# Patient Record
Sex: Female | Born: 1945 | Hispanic: No | Marital: Single | Smoking: Never smoker
Health system: Southern US, Community
[De-identification: ages and names within clinical notes are randomized; demographics above are authoritative.]

## PROBLEM LIST (undated history)

## (undated) DIAGNOSIS — H269 Unspecified cataract: Secondary | ICD-10-CM

## (undated) HISTORY — DX: Unspecified cataract: H26.9

## (undated) HISTORY — PX: OTHER SURGICAL HISTORY: SHX169

## (undated) HISTORY — PX: TONSILLECTOMY AND ADENOIDECTOMY: SUR1326

---

## 2009-02-23 HISTORY — PX: COLONOSCOPY: SHX174

## 2009-05-17 ENCOUNTER — Encounter (INDEPENDENT_AMBULATORY_CARE_PROVIDER_SITE_OTHER): Payer: Self-pay | Admitting: *Deleted

## 2009-05-29 ENCOUNTER — Encounter (INDEPENDENT_AMBULATORY_CARE_PROVIDER_SITE_OTHER): Payer: Self-pay | Admitting: *Deleted

## 2009-05-30 ENCOUNTER — Ambulatory Visit: Payer: Self-pay | Admitting: Gastroenterology

## 2009-06-17 ENCOUNTER — Ambulatory Visit: Payer: Self-pay | Admitting: Gastroenterology

## 2010-03-25 NOTE — Letter (Signed)
Summary: [facility] Instructions  [facility]  [address] [place], [place] [address]   Phone: [phone]  Fax: [phone]       Rita Olsen    05/11/1945    MRN: [id number]        Procedure Day Dorna Bloom:  Duanne Limerick  06/17/09     Arrival Time:  9:30AM     Procedure Time:  10:30AM     Location of Procedure:                    _ X_  [facility] (4th Floor)                        PREPARATION FOR COLONOSCOPY WITH MOVIPREP   Starting 5 days prior to your procedure 06/12/09 do not eat nuts, seeds, popcorn, corn, beans, peas,  salads, or any raw vegetables.  Do not take any fiber supplements (e.g. Metamucil, Citrucel, and Benefiber).  THE DAY BEFORE YOUR PROCEDURE         DATE: 06/16/09  DAY: SUNDAY  1.  Drink clear liquids the entire day-NO SOLID FOOD  2.  Do not drink anything colored red or purple.  Avoid juices with pulp.  No orange juice.  3.  Drink at least 64 oz. (8 glasses) of fluid/clear liquids during the day to prevent dehydration and help the prep work efficiently.  CLEAR LIQUIDS INCLUDE: Water Jello Ice Popsicles Tea (sugar ok, no milk/cream) Powdered fruit flavored drinks Coffee (sugar ok, no milk/cream) Gatorade Juice: apple, white grape, white cranberry  Lemonade Clear bullion, consomm, broth Carbonated beverages (any kind) Strained chicken noodle soup Hard Candy                             4.  In the morning, mix first dose of MoviPrep solution:    Empty 1 Pouch A and 1 Pouch B into the disposable container    Add lukewarm drinking water to the top line of the container. Mix to dissolve    Refrigerate (mixed solution should be used within 24 hrs)  5.  Begin drinking the prep at 5:00 p.m. The MoviPrep container is divided by 4 marks.   Every 15 minutes drink the solution down to the next mark (approximately 8 oz) until the full liter is complete.   6.  Follow completed prep with 16 oz of clear liquid of your choice  (Nothing red or purple).  Continue to drink clear liquids until bedtime.  7.  Before going to bed, mix second dose of MoviPrep solution:    Empty 1 Pouch A and 1 Pouch B into the disposable container    Add lukewarm drinking water to the top line of the container. Mix to dissolve    Refrigerate  THE DAY OF YOUR PROCEDURE      DATE: 06/17/09 DAY: MONDAY  Beginning at 5:30a.m. (5 hours before procedure):         1. Every 15 minutes, drink the solution down to the next mark (approx 8 oz) until the full liter is complete.  2. Follow completed prep with 16 oz. of clear liquid of your choice.    3. You may drink clear liquids until 8:30AM (2 HOURS BEFORE PROCEDURE).   MEDICATION INSTRUCTIONS  Unless otherwise instructed, you should take regular prescription medications with a small sip of water   as early as possible the morning of  your procedure.        OTHER INSTRUCTIONS  You will need a responsible adult at least 67 years of age to accompany you and drive you home.   This person must remain in the waiting room during your procedure.  Wear loose fitting clothing that is easily removed.  Leave jewelry and other valuables at home.  However, you may wish to bring a book to read or  an iPod/MP3 player to listen to music as you wait for your procedure to start.  Remove all body piercing jewelry and leave at home.  Total time from sign-in until discharge is approximately 2-3 hours.  You should go home directly after your procedure and rest.  You can resume normal activities the  day after your procedure.  The day of your procedure you should not:   Drive   Make legal decisions   Operate machinery   Drink alcohol   Return to work  You will receive specific instructions about eating, activities and medications before you leave.    The above instructions have been reviewed and explained to me by   Wyona Almas RN  May 30, 2009 8:23 AM     I fully understand  and can verbalize these instructions _____________________________ Date _________

## 2010-03-25 NOTE — Miscellaneous (Signed)
Summary: [facility] Previsit/prep  Clinical Lists Changes  Medications: Added new medication of MOVIPREP 100 GM  SOLR (PEG-KCL-NACL-NASULF-NA ASC-C) As per prep instructions. - Signed Rx of MOVIPREP 100 GM  SOLR (PEG-KCL-NACL-NASULF-NA ASC-C) As per prep instructions.;  #1 x 0;  Signed;  Entered by: Wyona Almas RN;  Authorized by: Mardella Layman MD [facility];  Method used: Electronically to [facility] [address], [place], [place], [place]  [address], Ph: [phone], Fax: [phone] Observations: Added new observation of NKA: T (05/30/2009 7:58)    Prescriptions: MOVIPREP 100 GM  SOLR (PEG-KCL-NACL-NASULF-NA ASC-C) As per prep instructions.  #1 x 0   Entered by:   Wyona Almas RN   Authorized by:   Mardella Layman MD [facility]   Signed by:   Wyona Almas RN on 05/30/2009   Method used:   Electronically to        [facility] (retail)       [address]       [place], [place]  [address]       Ph: [phone]       Fax: [phone]   RxID:   [id number]

## 2010-03-25 NOTE — Letter (Signed)
Summary: Previsit letter  [facility]  [address] [place], [place] [address]   Phone: [phone]  Fax: [phone]       05/17/2009 MRN: [id number]  Rita Olsen [address] [address] [place], [place]  [address]  Dear Ms. Spaugh,  Welcome to the Gastroenterology Division at [facility].    You are scheduled to see a nurse for your pre-procedure visit on 05/30/2009 at 8:00AM on the 3rd floor at [facility], [address]  We ask that you try to arrive at our office 15 minutes prior to your appointment time to allow for check-in.  Your nurse visit will consist of discussing your medical and surgical history, your immediate family medical history, and your medications.    Please bring a complete list of all your medications or, if you prefer, bring the medication bottles and we will list them.  We will need to be aware of both prescribed and over the counter drugs.  We will need to know exact dosage information as well.  If you are on blood thinners (Coumadin, Plavix, Aggrenox, Ticlid, etc.) please call our office today/prior to your appointment, as we need to consult with your physician about holding your medication.   Please be prepared to read and sign documents such as consent forms, a financial agreement, and acknowledgement forms.  If necessary, and with your consent, a friend or relative is welcome to sit-in on the nurse visit with you.  Please bring your insurance card so that we may make a copy of it.  If your insurance requires a referral to see a specialist, please bring your referral form from your primary care physician.  No co-pay is required for this nurse visit.     If you cannot keep your appointment, please call [phone] to cancel or reschedule prior to your appointment date.  This allows [place] the opportunity to schedule an appointment for another patient in need of care.    Thank you for choosing [facility] for your medical needs.  We  appreciate the opportunity to care for you.  Please visit [place] at our website  to learn more about our practice.                     Sincerely.                                                                                                                   The Gastroenterology Division

## 2010-03-25 NOTE — Procedures (Signed)
Summary: Colonoscopy  Patient: [name] Note: All result statuses are Final unless otherwise noted.  Tests: (1) Colonoscopy (COL)   COL Colonoscopy           DONE     [facility]     [address]     [place], [place]  [address]           COLONOSCOPY PROCEDURE REPORT           PATIENT:  Rita Olsen, Rita Olsen  MR#:  [id number]     BIRTHDATE:  1945/07/04, 63 yrs. old  GENDER:  female     ENDOSCOPIST:  Vania Rea. Jarold Motto, MD, [facility]     REF. BY:  Charlesetta Shanks, M.D.     PROCEDURE DATE:  06/17/2009     PROCEDURE:  Average-risk screening colonoscopy     G0121     ASA CLASS:  Class I     INDICATIONS:  Routine Risk Screening     MEDICATIONS:   Fentanyl 50 mcg IV, Versed 5 mg IV           DESCRIPTION OF PROCEDURE:   After the risks benefits and     alternatives of the procedure were thoroughly explained, informed     consent was obtained.  Digital rectal exam was performed and     revealed no abnormalities.   The LB [id number] endoscope     was introduced through the anus and advanced to the cecum, which     was identified by both the appendix and ileocecal valve, without     limitations.  The quality of the prep was excellent, using     MoviPrep.  The instrument was then slowly withdrawn as the colon     was fully examined.     <<PROCEDUREIMAGES>>           FINDINGS:  Melanosis coli was found.  No polyps or cancers were     seen.  This was otherwise a normal examination of the colon.     Retroflexed views in the rectum revealed not done.    The scope was     then withdrawn from the patient and the procedure completed.           COMPLICATIONS:  None     ENDOSCOPIC IMPRESSION:     1) Melanosis     2) No polyps or cancers     3) Otherwise normal examination     RECOMMENDATIONS:     1) Continue current colorectal screening recommendations for     "routine risk" patients with a repeat colonoscopy in 10 years.     REPEAT EXAM:  No        ______________________________     Vania Rea. Jarold Motto, MD, [facility]           CC:           n.     eSIGNED:   Vania Rea. Nathaniel Wakeley at 06/17/2009 11:19 AM           Ruby Cola, [id number]  Note: An exclamation mark (!) indicates a result that was not dispersed into the flowsheet. Document Creation Date: 06/17/2009 11:19 AM _______________________________________________________________________  (1) Order result status: Final Collection or observation date-time: 06/17/2009 11:14 Requested date-time:  Receipt date-time:  Reported date-time:  Referring Physician:   Ordering Physician: Sheryn Bison [phone]) Specimen Source:  Source: Launa Grill Order Number: [id number] Lab site:   Appended Document: Colonoscopy    Clinical Lists Changes  Observations:  Added new observation of COLONNXTDUE: 05/2019 (06/17/2009 12:42)

## 2011-04-15 ENCOUNTER — Other Ambulatory Visit (HOSPITAL_COMMUNITY): Payer: Self-pay | Admitting: Radiology

## 2011-04-15 DIAGNOSIS — R011 Cardiac murmur, unspecified: Secondary | ICD-10-CM

## 2011-04-17 ENCOUNTER — Other Ambulatory Visit: Payer: Self-pay

## 2011-04-17 ENCOUNTER — Encounter (HOSPITAL_COMMUNITY): Payer: Self-pay | Admitting: Internal Medicine

## 2011-04-17 ENCOUNTER — Ambulatory Visit (HOSPITAL_COMMUNITY): Payer: Medicare HMO | Attending: Cardiology

## 2011-04-17 DIAGNOSIS — R011 Cardiac murmur, unspecified: Secondary | ICD-10-CM

## 2011-04-17 DIAGNOSIS — I080 Rheumatic disorders of both mitral and aortic valves: Secondary | ICD-10-CM | POA: Insufficient documentation

## 2014-10-05 DIAGNOSIS — Z961 Presence of intraocular lens: Secondary | ICD-10-CM | POA: Diagnosis not present

## 2014-10-05 DIAGNOSIS — H01001 Unspecified blepharitis right upper eyelid: Secondary | ICD-10-CM | POA: Diagnosis not present

## 2014-10-05 DIAGNOSIS — H01004 Unspecified blepharitis left upper eyelid: Secondary | ICD-10-CM | POA: Diagnosis not present

## 2015-03-06 DIAGNOSIS — N390 Urinary tract infection, site not specified: Secondary | ICD-10-CM | POA: Diagnosis not present

## 2015-03-06 DIAGNOSIS — E559 Vitamin D deficiency, unspecified: Secondary | ICD-10-CM | POA: Diagnosis not present

## 2015-03-06 DIAGNOSIS — Z0000 Encounter for general adult medical examination without abnormal findings: Secondary | ICD-10-CM | POA: Diagnosis not present

## 2015-03-06 DIAGNOSIS — R8299 Other abnormal findings in urine: Secondary | ICD-10-CM | POA: Diagnosis not present

## 2015-03-13 DIAGNOSIS — E559 Vitamin D deficiency, unspecified: Secondary | ICD-10-CM | POA: Diagnosis not present

## 2015-03-13 DIAGNOSIS — Z0000 Encounter for general adult medical examination without abnormal findings: Secondary | ICD-10-CM | POA: Diagnosis not present

## 2015-03-13 DIAGNOSIS — R011 Cardiac murmur, unspecified: Secondary | ICD-10-CM | POA: Diagnosis not present

## 2015-03-13 DIAGNOSIS — Z780 Asymptomatic menopausal state: Secondary | ICD-10-CM | POA: Diagnosis not present

## 2015-03-13 DIAGNOSIS — M8580 Other specified disorders of bone density and structure, unspecified site: Secondary | ICD-10-CM | POA: Diagnosis not present

## 2015-03-13 DIAGNOSIS — Z6827 Body mass index (BMI) 27.0-27.9, adult: Secondary | ICD-10-CM | POA: Diagnosis not present

## 2015-03-13 DIAGNOSIS — Z1389 Encounter for screening for other disorder: Secondary | ICD-10-CM | POA: Diagnosis not present

## 2015-03-14 DIAGNOSIS — Z1212 Encounter for screening for malignant neoplasm of rectum: Secondary | ICD-10-CM | POA: Diagnosis not present

## 2015-03-22 DIAGNOSIS — R928 Other abnormal and inconclusive findings on diagnostic imaging of breast: Secondary | ICD-10-CM | POA: Diagnosis not present

## 2015-03-27 DIAGNOSIS — Z780 Asymptomatic menopausal state: Secondary | ICD-10-CM | POA: Diagnosis not present

## 2015-03-27 DIAGNOSIS — E559 Vitamin D deficiency, unspecified: Secondary | ICD-10-CM | POA: Diagnosis not present

## 2015-03-27 DIAGNOSIS — M859 Disorder of bone density and structure, unspecified: Secondary | ICD-10-CM | POA: Diagnosis not present

## 2015-06-27 DIAGNOSIS — L309 Dermatitis, unspecified: Secondary | ICD-10-CM | POA: Diagnosis not present

## 2015-10-11 DIAGNOSIS — Z961 Presence of intraocular lens: Secondary | ICD-10-CM | POA: Diagnosis not present

## 2016-03-19 DIAGNOSIS — Z0000 Encounter for general adult medical examination without abnormal findings: Secondary | ICD-10-CM | POA: Diagnosis not present

## 2016-03-19 DIAGNOSIS — E559 Vitamin D deficiency, unspecified: Secondary | ICD-10-CM | POA: Diagnosis not present

## 2016-03-27 DIAGNOSIS — M859 Disorder of bone density and structure, unspecified: Secondary | ICD-10-CM | POA: Diagnosis not present

## 2016-03-27 DIAGNOSIS — Z1389 Encounter for screening for other disorder: Secondary | ICD-10-CM | POA: Diagnosis not present

## 2016-03-27 DIAGNOSIS — E559 Vitamin D deficiency, unspecified: Secondary | ICD-10-CM | POA: Diagnosis not present

## 2016-03-27 DIAGNOSIS — Z6827 Body mass index (BMI) 27.0-27.9, adult: Secondary | ICD-10-CM | POA: Diagnosis not present

## 2016-03-27 DIAGNOSIS — D72819 Decreased white blood cell count, unspecified: Secondary | ICD-10-CM | POA: Diagnosis not present

## 2016-03-27 DIAGNOSIS — H6123 Impacted cerumen, bilateral: Secondary | ICD-10-CM | POA: Diagnosis not present

## 2016-03-27 DIAGNOSIS — Z780 Asymptomatic menopausal state: Secondary | ICD-10-CM | POA: Diagnosis not present

## 2016-03-27 DIAGNOSIS — Z0000 Encounter for general adult medical examination without abnormal findings: Secondary | ICD-10-CM | POA: Diagnosis not present

## 2016-03-27 DIAGNOSIS — R011 Cardiac murmur, unspecified: Secondary | ICD-10-CM | POA: Diagnosis not present

## 2016-04-03 DIAGNOSIS — Z1212 Encounter for screening for malignant neoplasm of rectum: Secondary | ICD-10-CM | POA: Diagnosis not present

## 2016-11-06 DIAGNOSIS — Z961 Presence of intraocular lens: Secondary | ICD-10-CM | POA: Diagnosis not present

## 2017-05-12 DIAGNOSIS — R82998 Other abnormal findings in urine: Secondary | ICD-10-CM | POA: Diagnosis not present

## 2017-05-12 DIAGNOSIS — E559 Vitamin D deficiency, unspecified: Secondary | ICD-10-CM | POA: Diagnosis not present

## 2017-05-12 DIAGNOSIS — D72819 Decreased white blood cell count, unspecified: Secondary | ICD-10-CM | POA: Diagnosis not present

## 2017-05-12 DIAGNOSIS — Z0000 Encounter for general adult medical examination without abnormal findings: Secondary | ICD-10-CM | POA: Diagnosis not present

## 2017-05-21 DIAGNOSIS — E559 Vitamin D deficiency, unspecified: Secondary | ICD-10-CM | POA: Diagnosis not present

## 2017-05-21 DIAGNOSIS — R21 Rash and other nonspecific skin eruption: Secondary | ICD-10-CM | POA: Diagnosis not present

## 2017-05-21 DIAGNOSIS — D72819 Decreased white blood cell count, unspecified: Secondary | ICD-10-CM | POA: Diagnosis not present

## 2017-05-21 DIAGNOSIS — R011 Cardiac murmur, unspecified: Secondary | ICD-10-CM | POA: Diagnosis not present

## 2017-05-21 DIAGNOSIS — Z9289 Personal history of other medical treatment: Secondary | ICD-10-CM | POA: Diagnosis not present

## 2017-05-21 DIAGNOSIS — R9431 Abnormal electrocardiogram [ECG] [EKG]: Secondary | ICD-10-CM | POA: Diagnosis not present

## 2017-05-21 DIAGNOSIS — Z0000 Encounter for general adult medical examination without abnormal findings: Secondary | ICD-10-CM | POA: Diagnosis not present

## 2017-05-21 DIAGNOSIS — Z780 Asymptomatic menopausal state: Secondary | ICD-10-CM | POA: Diagnosis not present

## 2017-05-21 DIAGNOSIS — M8580 Other specified disorders of bone density and structure, unspecified site: Secondary | ICD-10-CM | POA: Diagnosis not present

## 2017-05-26 DIAGNOSIS — Z1212 Encounter for screening for malignant neoplasm of rectum: Secondary | ICD-10-CM | POA: Diagnosis not present

## 2017-07-01 DIAGNOSIS — M859 Disorder of bone density and structure, unspecified: Secondary | ICD-10-CM | POA: Diagnosis not present

## 2017-07-01 DIAGNOSIS — E8809 Other disorders of plasma-protein metabolism, not elsewhere classified: Secondary | ICD-10-CM | POA: Diagnosis not present

## 2017-07-06 DIAGNOSIS — D890 Polyclonal hypergammaglobulinemia: Secondary | ICD-10-CM | POA: Diagnosis not present

## 2017-08-30 DIAGNOSIS — Z6827 Body mass index (BMI) 27.0-27.9, adult: Secondary | ICD-10-CM | POA: Diagnosis not present

## 2017-08-30 DIAGNOSIS — M7090 Unspecified soft tissue disorder related to use, overuse and pressure of unspecified site: Secondary | ICD-10-CM | POA: Diagnosis not present

## 2017-08-30 DIAGNOSIS — R1084 Generalized abdominal pain: Secondary | ICD-10-CM | POA: Diagnosis not present

## 2017-08-30 DIAGNOSIS — N390 Urinary tract infection, site not specified: Secondary | ICD-10-CM | POA: Diagnosis not present

## 2017-08-30 DIAGNOSIS — R109 Unspecified abdominal pain: Secondary | ICD-10-CM | POA: Diagnosis not present

## 2017-11-11 DIAGNOSIS — H52202 Unspecified astigmatism, left eye: Secondary | ICD-10-CM | POA: Diagnosis not present

## 2017-11-11 DIAGNOSIS — Z961 Presence of intraocular lens: Secondary | ICD-10-CM | POA: Diagnosis not present

## 2018-01-05 DIAGNOSIS — Z1231 Encounter for screening mammogram for malignant neoplasm of breast: Secondary | ICD-10-CM | POA: Diagnosis not present

## 2018-06-20 DIAGNOSIS — R7989 Other specified abnormal findings of blood chemistry: Secondary | ICD-10-CM | POA: Diagnosis not present

## 2018-06-20 DIAGNOSIS — E559 Vitamin D deficiency, unspecified: Secondary | ICD-10-CM | POA: Diagnosis not present

## 2018-06-20 DIAGNOSIS — R82998 Other abnormal findings in urine: Secondary | ICD-10-CM | POA: Diagnosis not present

## 2018-06-21 DIAGNOSIS — Z1331 Encounter for screening for depression: Secondary | ICD-10-CM | POA: Diagnosis not present

## 2018-06-21 DIAGNOSIS — Z1389 Encounter for screening for other disorder: Secondary | ICD-10-CM | POA: Diagnosis not present

## 2018-06-21 DIAGNOSIS — Z0000 Encounter for general adult medical examination without abnormal findings: Secondary | ICD-10-CM | POA: Diagnosis not present

## 2018-11-17 DIAGNOSIS — Z961 Presence of intraocular lens: Secondary | ICD-10-CM | POA: Diagnosis not present

## 2019-03-28 DIAGNOSIS — H16123 Filamentary keratitis, bilateral: Secondary | ICD-10-CM | POA: Diagnosis not present

## 2019-04-05 DIAGNOSIS — H04123 Dry eye syndrome of bilateral lacrimal glands: Secondary | ICD-10-CM | POA: Diagnosis not present

## 2019-07-21 DIAGNOSIS — R7989 Other specified abnormal findings of blood chemistry: Secondary | ICD-10-CM | POA: Diagnosis not present

## 2019-07-21 DIAGNOSIS — M859 Disorder of bone density and structure, unspecified: Secondary | ICD-10-CM | POA: Diagnosis not present

## 2019-07-28 DIAGNOSIS — Z0000 Encounter for general adult medical examination without abnormal findings: Secondary | ICD-10-CM | POA: Diagnosis not present

## 2019-07-28 DIAGNOSIS — Z9289 Personal history of other medical treatment: Secondary | ICD-10-CM | POA: Diagnosis not present

## 2019-07-28 DIAGNOSIS — M859 Disorder of bone density and structure, unspecified: Secondary | ICD-10-CM | POA: Diagnosis not present

## 2019-07-28 DIAGNOSIS — E7849 Other hyperlipidemia: Secondary | ICD-10-CM | POA: Diagnosis not present

## 2019-07-28 DIAGNOSIS — R21 Rash and other nonspecific skin eruption: Secondary | ICD-10-CM | POA: Diagnosis not present

## 2019-07-28 DIAGNOSIS — D72819 Decreased white blood cell count, unspecified: Secondary | ICD-10-CM | POA: Diagnosis not present

## 2019-07-28 DIAGNOSIS — R82998 Other abnormal findings in urine: Secondary | ICD-10-CM | POA: Diagnosis not present

## 2019-07-28 DIAGNOSIS — R9431 Abnormal electrocardiogram [ECG] [EKG]: Secondary | ICD-10-CM | POA: Diagnosis not present

## 2019-07-28 DIAGNOSIS — H6123 Impacted cerumen, bilateral: Secondary | ICD-10-CM | POA: Diagnosis not present

## 2019-07-28 DIAGNOSIS — R011 Cardiac murmur, unspecified: Secondary | ICD-10-CM | POA: Diagnosis not present

## 2019-07-28 DIAGNOSIS — E559 Vitamin D deficiency, unspecified: Secondary | ICD-10-CM | POA: Diagnosis not present

## 2019-08-01 DIAGNOSIS — Z1231 Encounter for screening mammogram for malignant neoplasm of breast: Secondary | ICD-10-CM | POA: Diagnosis not present

## 2019-08-03 ENCOUNTER — Other Ambulatory Visit: Payer: Self-pay | Admitting: Internal Medicine

## 2019-08-03 ENCOUNTER — Encounter: Payer: Self-pay | Admitting: Gastroenterology

## 2019-08-03 DIAGNOSIS — E785 Hyperlipidemia, unspecified: Secondary | ICD-10-CM

## 2019-08-09 DIAGNOSIS — Z1212 Encounter for screening for malignant neoplasm of rectum: Secondary | ICD-10-CM | POA: Diagnosis not present

## 2019-08-22 ENCOUNTER — Ambulatory Visit
Admission: RE | Admit: 2019-08-22 | Discharge: 2019-08-22 | Disposition: A | Discharge status: Home / Self Care | Payer: Medicare HMO | Source: Ambulatory Visit | Attending: Internal Medicine | Admitting: Internal Medicine

## 2019-08-22 DIAGNOSIS — E785 Hyperlipidemia, unspecified: Secondary | ICD-10-CM

## 2019-08-31 ENCOUNTER — Encounter: Payer: Self-pay | Admitting: Gastroenterology

## 2019-09-06 DIAGNOSIS — H6123 Impacted cerumen, bilateral: Secondary | ICD-10-CM | POA: Diagnosis not present

## 2019-09-06 DIAGNOSIS — J343 Hypertrophy of nasal turbinates: Secondary | ICD-10-CM | POA: Diagnosis not present

## 2019-09-06 DIAGNOSIS — Z9089 Acquired absence of other organs: Secondary | ICD-10-CM | POA: Diagnosis not present

## 2019-09-20 ENCOUNTER — Other Ambulatory Visit: Payer: Self-pay

## 2019-09-20 ENCOUNTER — Encounter: Payer: Self-pay | Admitting: Gastroenterology

## 2019-09-20 ENCOUNTER — Ambulatory Visit (AMBULATORY_SURGERY_CENTER): Payer: Self-pay

## 2019-09-20 VITALS — Ht 63.0 in | Wt 152.4 lb

## 2019-09-20 DIAGNOSIS — Z1211 Encounter for screening for malignant neoplasm of colon: Secondary | ICD-10-CM

## 2019-09-20 NOTE — Progress Notes (Signed)
No allergies to soy or egg Pt is not on blood thinners or diet pills Denies issues with sedation/intubation Denies atrial flutter/fib Denies constipation   Pt is aware of Covid safety and care partner requirements.      

## 2019-10-04 ENCOUNTER — Encounter: Payer: Self-pay | Admitting: Gastroenterology

## 2019-10-04 ENCOUNTER — Other Ambulatory Visit: Payer: Self-pay

## 2019-10-04 ENCOUNTER — Ambulatory Visit (AMBULATORY_SURGERY_CENTER): Payer: Medicare HMO | Admitting: Gastroenterology

## 2019-10-04 VITALS — BP 140/78 | HR 55 | Temp 97.7°F | Resp 21 | Ht 63.0 in | Wt 152.4 lb

## 2019-10-04 DIAGNOSIS — Z1211 Encounter for screening for malignant neoplasm of colon: Secondary | ICD-10-CM | POA: Diagnosis not present

## 2019-10-04 DIAGNOSIS — Z86010 Personal history of colonic polyps: Secondary | ICD-10-CM | POA: Diagnosis not present

## 2019-10-04 MED ORDER — SODIUM CHLORIDE 0.9 % IV SOLN
500.0000 mL | Freq: Once | INTRAVENOUS | Status: DC
Start: 2019-10-04 — End: 2019-10-04

## 2019-10-04 NOTE — Progress Notes (Signed)
Pt's states no medical or surgical changes since previsit or office visit. 

## 2019-10-04 NOTE — Progress Notes (Signed)
Report given to PACU, vss 

## 2019-10-04 NOTE — Patient Instructions (Signed)
Thank you for allowing [place] to care for you today!  Resume previous diet and medications today.  Recommend incorporating a high-fiber diet, hand-out given.  Recommend adding FiberCon 1-2 tablets by mouth daily.  Return to your normal activities tomorrow.  No further routine colonoscopies recommended based on current age guidelines.  Please contact us if any issues should arise.    YOU HAD AN ENDOSCOPIC PROCEDURE TODAY AT THE [facility]:   Refer to the procedure report that was given to you for any specific questions about what was found during the examination.  If the procedure report does not answer your questions, please call your gastroenterologist to clarify.  If you requested that your care partner not be given the details of your procedure findings, then the procedure report has been included in a sealed envelope for you to review at your convenience later.  YOU SHOULD EXPECT: Some feelings of bloating in the abdomen. Passage of more gas than usual.  Walking can help get rid of the air that was put into your GI tract during the procedure and reduce the bloating. If you had a lower endoscopy (such as a colonoscopy or flexible sigmoidoscopy) you may notice spotting of blood in your stool or on the toilet paper. If you underwent a bowel prep for your procedure, you may not have a normal bowel movement for a few days.  Please Note:  You might notice some irritation and congestion in your nose or some drainage.  This is from the oxygen used during your procedure.  There is no need for concern and it should clear up in a day or so.  SYMPTOMS TO REPORT IMMEDIATELY:   Following lower endoscopy (colonoscopy or flexible sigmoidoscopy):  Excessive amounts of blood in the stool  Significant tenderness or worsening of abdominal pains  Swelling of the abdomen that is new, acute  Fever of 100F or higher   For urgent or emergent issues, a gastroenterologist can be reached at any hour  by calling [phone]. Do not use MyChart messaging for urgent concerns.    DIET:  We do recommend a small meal at first, but then you may proceed to your regular diet.  Drink plenty of fluids but you should avoid alcoholic beverages for 24 hours.  ACTIVITY:  You should plan to take it easy for the rest of today and you should NOT DRIVE or use heavy machinery until tomorrow (because of the sedation medicines used during the test).    FOLLOW UP: Our staff will call the number listed on your records 48-72 hours following your procedure to check on you and address any questions or concerns that you may have regarding the information given to you following your procedure. If we do not reach you, we will leave a message.  We will attempt to reach you two times.  During this call, we will ask if you have developed any symptoms of COVID 19. If you develop any symptoms (ie: fever, flu-like symptoms, shortness of breath, cough etc.) before then, please call [phone].  If you test positive for Covid 19 in the 2 weeks post procedure, please call and report this information to [place].    If any biopsies were taken you will be contacted by phone or by letter within the next 1-3 weeks.  Please call us at [phone] if you have not heard about the biopsies in 3 weeks.    SIGNATURES/CONFIDENTIALITY: You and/or your care partner have signed paperwork which will  be entered into your electronic medical record.  These signatures attest to the fact that that the information above on your After Visit Summary has been reviewed and is understood.  Full responsibility of the confidentiality of this discharge information lies with you and/or your care-partner. 

## 2019-10-04 NOTE — Op Note (Signed)
[facility] Patient Name: Rita Olsen Procedure Date: 10/04/2019 9:08 AM MRN: [id number] Endoscopist: Justice Britain , MD Age: 74 Referring MD:  Date of Birth: Oct 25, 1945 Gender: Female Account #: [id number] Procedure:                Colonoscopy Indications:              Screening for colorectal malignant neoplasm Medicines:                Monitored Anesthesia Care Procedure:                Pre-Anesthesia Assessment:                           - Prior to the procedure, a History and Physical                            was performed, and patient medications and                            allergies were reviewed. The patient's tolerance of                            previous anesthesia was also reviewed. The risks                            and benefits of the procedure and the sedation                            options and risks were discussed with the patient.                            All questions were answered, and informed consent                            was obtained. Prior Anticoagulants: The patient has                            taken no previous anticoagulant or antiplatelet                            agents. ASA Grade Assessment: I - A normal, healthy                            patient. After reviewing the risks and benefits,                            the patient was deemed in satisfactory condition to                            undergo the procedure.                           After obtaining informed consent, the colonoscope  was passed under direct vision. Throughout the                            procedure, the patient's blood pressure, pulse, and                            oxygen saturations were monitored continuously. The                            Colonoscope was introduced through the anus and                            advanced to the the cecum, identified by                            appendiceal orifice and ileocecal  valve. The                            colonoscopy was somewhat difficult due to a                            tortuous colon. Successful completion of the                            procedure was aided by changing the patient's                            position, using manual pressure, straightening and                            shortening the scope to obtain bowel loop reduction                            and using scope torsion. The patient tolerated the                            procedure. The quality of the bowel preparation was                            good. The ileocecal valve, appendiceal orifice, and                            rectum were photographed. Scope In: 9:19:59 AM Scope Out: 9:37:20 AM Scope Withdrawal Time: 0 hours 11 minutes 32 seconds  Total Procedure Duration: 0 hours 17 minutes 21 seconds  Findings:                 The digital rectal exam findings include                            hemorrhoids. Pertinent negatives include no                            palpable rectal lesions.  The terminal ileum and ileocecal valve appeared                            normal.                           Other than some melanosis scattered through the                            colon, normal mucosa was found in the entire colon.                           Diverticulosis noted in the distal sigmoid colon                            and rectosigmoid colon.                           Non-bleeding non-thrombosed internal hemorrhoids                            were found during retroflexion, during perianal                            exam and during digital exam. The hemorrhoids were                            Grade II (internal hemorrhoids that prolapse but                            reduce spontaneously). Complications:            No immediate complications. Estimated Blood Loss:     Estimated blood loss: none. Impression:               - Hemorrhoids found on  digital rectal exam.                           - Normal mucosa at TI and IC Valve.                           - Melanosis scattered but otherwise normal mucosa                            in the entire examined colon.                           - Diverticulosis noted in the distal sigmoid colon                            and rectosigmoid colon.                           - Non-bleeding non-thrombosed internal hemorrhoids. Recommendation:           - The patient will be observed post-procedure,  until all discharge criteria are met.                           - Discharge patient to home.                           - Patient has a contact number available for                            emergencies. The signs and symptoms of potential                            delayed complications were discussed with the                            patient. Return to normal activities tomorrow.                            Written discharge instructions were provided to the                            patient.                           - High fiber diet.                           - Use FiberCon 1-2 tablets PO daily.                           - Continue present medications.                           - Would normally recommend a repeat colonoscopy in                            10 years for screening purposes but her age will be                            75. If her health remains excellent then we can                            certainly entertain repeat colonoscopy but would                            need to discuss with PCP and with [place] in clinic.                            Likely this will be her last colonoscopy.                           - The findings and recommendations were discussed                            with the patient.  Justice Britain, MD 10/04/2019 9:44:36 AM

## 2019-10-06 ENCOUNTER — Telehealth: Payer: Self-pay | Admitting: *Deleted

## 2019-10-06 NOTE — Telephone Encounter (Signed)
  Follow up Call-  Call back number 10/04/2019  Post procedure Call Back phone  # [phone]  Permission to leave phone message Yes  Some recent data might be hidden     Patient questions:  Do you have a fever, pain , or abdominal swelling? No. Pain Score  0 *  Have you tolerated food without any problems? Yes.    Have you been able to return to your normal activities? Yes.    Do you have any questions about your discharge instructions: Diet   No. Medications  No. Follow up visit  No.  Do you have questions or concerns about your Care? Yes.    Actions: * If pain score is 4 or above: 1. No action needed, pain <4.Have you developed a fever since your procedure? no  2.   Have you had an respiratory symptoms (SOB or cough) since your procedure? no  3.   Have you tested positive for COVID 19 since your procedure no  4.   Have you had any family members/close contacts diagnosed with the COVID 19 since your procedure?  no   If yes to any of these questions please route to Laverna Peace, RN and Karlton Lemon, RN

## 2019-10-06 NOTE — Telephone Encounter (Signed)
  Follow up Call-  Call back number 10/04/2019  Post procedure Call Back phone  # [phone]  Permission to leave phone message Yes  Some recent data might be hidden     Patient questions:  Do you have a fever, pain , or abdominal swelling? No. Pain Score  0 *  Have you tolerated food without any problems? Yes.    Have you been able to return to your normal activities? Yes.    Do you have any questions about your discharge instructions: Diet   No. Medications  No. Follow up visit  No.  Do you have questions or concerns about your Care? No.  Actions: * If pain score is 4 or above: 1. No action needed, pain <4.Have you developed a fever since your procedure? no  2.   Have you had an respiratory symptoms (SOB or cough) since your procedure? no  3.   Have you tested positive for COVID 19 since your procedure no  4.   Have you had any family members/close contacts diagnosed with the COVID 19 since your procedure?  no   If yes to any of these questions please route to Laverna Peace, RN and Karlton Lemon, RN

## 2019-11-05 ENCOUNTER — Ambulatory Visit: Payer: Medicare HMO | Attending: Internal Medicine

## 2019-11-05 ENCOUNTER — Other Ambulatory Visit: Payer: Self-pay

## 2019-11-05 DIAGNOSIS — Z23 Encounter for immunization: Secondary | ICD-10-CM

## 2019-11-05 NOTE — Progress Notes (Signed)
   Covid-19 Vaccination Clinic  Name:  Gwenivere Hiraldo    MRN: [id number] DOB: [birth year]  11/05/2019  Ms. Lansdowne was observed post Covid-19 immunization for 15 minutes without incident. She was provided with Vaccine Information Sheet and instruction to access the V-Safe system.   Ms. Wisham was instructed to call 911 with any severe reactions post vaccine: Marland Kitchen Difficulty breathing  . Swelling of face and throat  . A fast heartbeat  . A bad rash all over body  . Dizziness and weakness

## 2019-11-22 DIAGNOSIS — R0789 Other chest pain: Secondary | ICD-10-CM | POA: Diagnosis not present

## 2019-11-22 DIAGNOSIS — Z23 Encounter for immunization: Secondary | ICD-10-CM | POA: Diagnosis not present

## 2019-11-24 DIAGNOSIS — H16123 Filamentary keratitis, bilateral: Secondary | ICD-10-CM | POA: Diagnosis not present

## 2019-11-24 DIAGNOSIS — Z961 Presence of intraocular lens: Secondary | ICD-10-CM | POA: Diagnosis not present

## 2019-11-24 DIAGNOSIS — H04123 Dry eye syndrome of bilateral lacrimal glands: Secondary | ICD-10-CM | POA: Diagnosis not present

## 2020-06-07 ENCOUNTER — Ambulatory Visit
Admission: EM | Admit: 2020-06-07 | Discharge: 2020-06-07 | Disposition: A | Discharge status: Home / Self Care | Payer: Medicare HMO | Attending: Family Medicine | Admitting: Family Medicine

## 2020-06-07 ENCOUNTER — Encounter: Payer: Self-pay | Admitting: Emergency Medicine

## 2020-06-07 ENCOUNTER — Other Ambulatory Visit: Payer: Self-pay

## 2020-06-07 DIAGNOSIS — M79671 Pain in right foot: Secondary | ICD-10-CM | POA: Diagnosis not present

## 2020-06-07 DIAGNOSIS — M795 Residual foreign body in soft tissue: Secondary | ICD-10-CM | POA: Diagnosis not present

## 2020-06-07 NOTE — ED Provider Notes (Signed)
[name]    CSN: [id number] Arrival date & time: 06/07/20  1124      History   Chief Complaint Chief Complaint  Patient presents with  . Foreign Body in Skin    Right foot    HPI Rita Olsen is a 75 y.o. female.   Reports that she feels that there is "something embedded in the bottom of the right foot" for the last 2 days. Describes the area as red, painful to walk on. Denies drainage or bleeding from the area. Reports that she often exercises barefoot. Denies previous symptoms. Has not attempted OTC treatment. Denies weakness, erythema, drainage, swelling, fever, rash, other symptoms.  ROS per HPI  The history is provided by the patient.    Past Medical History:  Diagnosis Date  . Cataract    bilaterally and repaired.    There are no problems to display for this patient.   Past Surgical History:  Procedure Laterality Date  . Cataract repair    . COLONOSCOPY  2011  . TONSILLECTOMY AND ADENOIDECTOMY      OB History   No obstetric history on file.      Home Medications    Prior to Admission medications   Medication Sig Start Date End Date Taking? Authorizing Provider  COD LIVER OIL PO Take by mouth.    [provider]  Multiple Vitamin (MULTIVITAMIN PO) Take by mouth.    [provider]  Polyethyl Glycol-Propyl Glycol (SYSTANE HYDRATION PF OP) Apply 1 drop to eye.    [provider]    Family History Family History  Problem Relation Age of Onset  . Colon polyps Sister   . Colon cancer Neg Hx   . Esophageal cancer Neg Hx   . Prostate cancer Neg Hx   . Rectal cancer Neg Hx     Social History Social History   Tobacco Use  . Smoking status: Never Smoker  . Smokeless tobacco: Never Used  Vaping Use  . Vaping Use: Never used  Substance Use Topics  . Alcohol use: Yes    Comment: occassionally  . Drug use: Never     Allergies   Patient has no known allergies.   Review of Systems Review of  Systems   Physical Exam Triage Vital Signs ED Triage Vitals  Enc Vitals Group     BP 06/07/20 1224 138/78     Pulse Rate 06/07/20 1224 71     Resp 06/07/20 1224 16     Temp 06/07/20 1224 98.1 F (36.7 C)     Temp Source 06/07/20 1224 Oral     SpO2 06/07/20 1224 97 %     Weight --      Height --      Head Circumference --      Peak Flow --      Pain Score 06/07/20 1222 0     Pain Loc --      Pain Edu? --      Excl. in GC? --    No data found.  Updated Vital Signs BP 138/78 (BP Location: Right Arm)   Pulse 71   Temp 98.1 F (36.7 C) (Oral)   Resp 16   SpO2 97%     Physical Exam Vitals and nursing note reviewed.  Constitutional:      General: She is not in acute distress.    Appearance: Normal appearance. She is well-developed. She is not ill-appearing.  HENT:     Head: Normocephalic  and atraumatic.  Eyes:     Extraocular Movements: Extraocular movements intact.     Conjunctiva/sclera: Conjunctivae normal.     Pupils: Pupils are equal, round, and reactive to light.  Cardiovascular:     Rate and Rhythm: Normal rate and regular rhythm.     Heart sounds: No murmur heard.   Pulmonary:     Effort: Pulmonary effort is normal. No respiratory distress.     Breath sounds: Normal breath sounds.  Abdominal:     Palpations: Abdomen is soft.     Tenderness: There is no abdominal tenderness.  Musculoskeletal:        General: Normal range of motion.     Cervical back: Normal range of motion and neck supple.       Feet:  Skin:    General: Skin is warm and dry.     Capillary Refill: Capillary refill takes less than 2 seconds.  Neurological:     General: No focal deficit present.     Mental Status: She is alert and oriented to person, place, and time.  Psychiatric:        Mood and Affect: Mood normal.        Behavior: Behavior normal.      UC Treatments / Results  Labs (all labs ordered are listed, but only abnormal results are displayed) Labs Reviewed - No  data to display  EKG   Radiology No results found.  Procedures Foreign Body Removal  Date/Time: 06/07/2020 12:38 PM Performed by: Moshe Cipro, NP Authorized by: Moshe Cipro, NP   Consent:    Consent obtained:  Verbal   Consent given by:  Patient   Risks discussed:  Bleeding, infection and incomplete removal   Alternatives discussed:  No treatment and alternative treatment Universal protocol:    Patient identity confirmed:  Verbally with patient and arm band Location:    Location:  Foot   Foot location:  R sole   Depth:  Intradermal   Tendon involvement:  None Pre-procedure details:    Imaging:  None   Neurovascular status: intact   Anesthesia:    Anesthesia method:  None Procedure type:    Procedure complexity:  Simple Procedure details:    Localization method:  Visualized   Dissection of underlying tissues: no     Bloodless field: yes     Removal mechanism:  Forceps   Foreign bodies recovered:  1   Description:  Small shard of glass   Intact foreign body removal: yes   Post-procedure details:    Neurovascular status: intact     Confirmation:  No additional foreign bodies on visualization   Skin closure:  None   Dressing:  Open (no dressing)   Procedure completion:  Tolerated well, no immediate complications   (including critical care time)  Medications Ordered in UC Medications - No data to display  Initial Impression / Assessment and Plan / UC Course  I have reviewed the triage vital signs and the nursing notes.  Pertinent labs & imaging results that were available during my care of the patient were reviewed by me and considered in my medical decision making (see chart for details).    Foreign body in skin R foot pain  Glass shard removed from sole of R foot as above Follow up for any s/s infection Follow up with PCP as needed  Final Clinical Impressions(s) / UC Diagnoses   Final diagnoses:  Right foot pain  Foreign body (FB) in  soft tissue  Discharge Instructions     We removed a small piece of glass from the bottom of your right foot  The area should heal just fine  Follow up as needed    ED Prescriptions    None     PDMP not reviewed this encounter.   Moshe Cipro, NP 06/07/20 [place]

## 2020-06-07 NOTE — ED Triage Notes (Signed)
Pt presents today with c/o of "something embedded in bottom of right foot" x 2 days.

## 2020-06-07 NOTE — Discharge Instructions (Signed)
We removed a small piece of glass from the bottom of your right foot  The area should heal just fine  Follow up as needed

## 2020-08-06 DIAGNOSIS — M85852 Other specified disorders of bone density and structure, left thigh: Secondary | ICD-10-CM | POA: Diagnosis not present

## 2020-08-06 DIAGNOSIS — Z1231 Encounter for screening mammogram for malignant neoplasm of breast: Secondary | ICD-10-CM | POA: Diagnosis not present

## 2020-08-06 DIAGNOSIS — M85851 Other specified disorders of bone density and structure, right thigh: Secondary | ICD-10-CM | POA: Diagnosis not present

## 2020-08-14 DIAGNOSIS — E559 Vitamin D deficiency, unspecified: Secondary | ICD-10-CM | POA: Diagnosis not present

## 2020-08-14 DIAGNOSIS — R7301 Impaired fasting glucose: Secondary | ICD-10-CM | POA: Diagnosis not present

## 2020-08-14 DIAGNOSIS — E785 Hyperlipidemia, unspecified: Secondary | ICD-10-CM | POA: Diagnosis not present

## 2020-08-14 DIAGNOSIS — Z0000 Encounter for general adult medical examination without abnormal findings: Secondary | ICD-10-CM | POA: Diagnosis not present

## 2020-08-21 DIAGNOSIS — E785 Hyperlipidemia, unspecified: Secondary | ICD-10-CM | POA: Diagnosis not present

## 2020-08-21 DIAGNOSIS — R7301 Impaired fasting glucose: Secondary | ICD-10-CM | POA: Diagnosis not present

## 2020-08-21 DIAGNOSIS — E8809 Other disorders of plasma-protein metabolism, not elsewhere classified: Secondary | ICD-10-CM | POA: Diagnosis not present

## 2020-08-21 DIAGNOSIS — M8580 Other specified disorders of bone density and structure, unspecified site: Secondary | ICD-10-CM | POA: Diagnosis not present

## 2020-08-21 DIAGNOSIS — Z23 Encounter for immunization: Secondary | ICD-10-CM | POA: Diagnosis not present

## 2020-08-21 DIAGNOSIS — D72819 Decreased white blood cell count, unspecified: Secondary | ICD-10-CM | POA: Diagnosis not present

## 2020-08-21 DIAGNOSIS — R748 Abnormal levels of other serum enzymes: Secondary | ICD-10-CM | POA: Diagnosis not present

## 2020-08-21 DIAGNOSIS — Z0000 Encounter for general adult medical examination without abnormal findings: Secondary | ICD-10-CM | POA: Diagnosis not present

## 2020-08-21 DIAGNOSIS — R82998 Other abnormal findings in urine: Secondary | ICD-10-CM | POA: Diagnosis not present

## 2020-08-21 DIAGNOSIS — E559 Vitamin D deficiency, unspecified: Secondary | ICD-10-CM | POA: Diagnosis not present

## 2020-09-05 ENCOUNTER — Telehealth: Payer: Self-pay | Admitting: Oncology

## 2020-09-05 NOTE — Telephone Encounter (Signed)
Received a new hem referral from Dr. Waynard Edwards for neutropenia. Rita Olsen has been cld and scheduled to see Dr. Clelia Croft on 7/20 at 11am. Pt aware to arrive 20 minutes early.Letter mailed.

## 2020-09-11 ENCOUNTER — Other Ambulatory Visit: Payer: Self-pay

## 2020-09-11 ENCOUNTER — Inpatient Hospital Stay: Payer: Medicare HMO | Attending: Oncology | Admitting: Oncology

## 2020-09-11 VITALS — BP 123/59 | HR 73 | Temp 98.2°F | Resp 16 | Ht 63.0 in | Wt 152.8 lb

## 2020-09-11 DIAGNOSIS — D709 Neutropenia, unspecified: Secondary | ICD-10-CM | POA: Insufficient documentation

## 2020-09-11 NOTE — Progress Notes (Signed)
Reason for the request:    Neutropenia  HPI: I was asked by Dr. Haynes Kerns to evaluate Rita Olsen for the evaluation of neutropenia.  She is a 75 year old woman without any significant comorbid conditions who underwent a routine physical in June 2022.  Laboratory data at that time showed total white cell count of 3.38 with neutrophil percentage of 26% and absolute neutrophil count of 900.  Her hemoglobin was normal at 13.8 with normal platelet count.  The white cell count differential was also normal.  Her AST and ALT were mildly elevated but otherwise normal liver function test and electrolytes.  Clinically, she is asymptomatic at this time.  She denies any recurrent infections or hospitalizations.  Her performance status quality of life remain excellent.  She denies any arthritis or joint pain.  She denies any weight loss or appetite changes.  She does not report any headaches, blurry vision, syncope or seizures. Does not report any fevers, chills or sweats.  Does not report any cough, wheezing or hemoptysis.  Does not report any chest pain, palpitation, orthopnea or leg edema.  Does not report any nausea, vomiting or abdominal pain.  Does not report any constipation or diarrhea.  Does not report any skeletal complaints.    Does not report frequency, urgency or hematuria.  Does not report any skin rashes or lesions. Does not report any heat or cold intolerance.  Does not report any lymphadenopathy or petechiae.  Does not report any anxiety or depression.  Remaining review of systems is negative.     Past Medical History:  Diagnosis Date   Cataract    bilaterally and repaired.  :   Past Surgical History:  Procedure Laterality Date   Cataract repair     COLONOSCOPY  2011   TONSILLECTOMY AND ADENOIDECTOMY    :   Current Outpatient Medications:    COD LIVER OIL PO, Take by mouth., Disp: , Rfl:    Multiple Vitamin (MULTIVITAMIN PO), Take by mouth., Disp: , Rfl:    Polyethyl Glycol-Propyl Glycol  (SYSTANE HYDRATION PF OP), Apply 1 drop to eye., Disp: , Rfl: :  No Known Allergies:   Family History  Problem Relation Age of Onset   Colon polyps Sister    Colon cancer Neg Hx    Esophageal cancer Neg Hx    Prostate cancer Neg Hx    Rectal cancer Neg Hx   :   Social History   Socioeconomic History   Marital status: Single    Spouse name: Not on file   Number of children: Not on file   Years of education: Not on file   Highest education level: Not on file  Occupational History   Not on file  Tobacco Use   Smoking status: Never   Smokeless tobacco: Never  Vaping Use   Vaping Use: Never used  Substance and Sexual Activity   Alcohol use: Yes    Comment: occassionally   Drug use: Never   Sexual activity: Not on file  Other Topics Concern   Not on file  Social History Narrative   Not on file   Social Determinants of Health   Financial Resource Strain: Not on file  Food Insecurity: Not on file  Transportation Needs: Not on file  Physical Activity: Not on file  Stress: Not on file  Social Connections: Not on file  Intimate Partner Violence: Not on file  :  Pertinent items are noted in HPI.  Exam: Blood pressure (!) 123/59, pulse 73,  temperature 98.2 F (36.8 C), temperature source Oral, resp. rate 16, height 5' 3"  (1.6 m), weight 152 lb 12.8 oz (69.3 kg), SpO2 99 %. ECOG 0 General appearance: alert and cooperative appeared without distress. Head: atraumatic without any abnormalities. Eyes: conjunctivae/corneas clear. PERRL.  Sclera anicteric. Throat: lips, mucosa, and tongue normal; without oral thrush or ulcers. Resp: clear to auscultation bilaterally without rhonchi, wheezes or dullness to percussion. Cardio: regular rate and rhythm, S1, S2 normal, no murmur, click, rub or gallop GI: soft, non-tender; bowel sounds normal; no masses,  no organomegaly Skin: Skin color, texture, turgor normal. No rashes or lesions Lymph nodes: Cervical, supraclavicular, and  axillary nodes normal. Neurologic: Grossly normal without any motor, sensory or deep tendon reflexes. Musculoskeletal: No joint deformity or effusion.    Assessment and Plan:   75 year old with:  1.  Neutropenia detected on CBC in June 2022 after presenting with a white cell count of 3.3 and absolute neutrophil count of 900.  She has normal hemoglobin and platelet count.  Her labs otherwise normal and symptoms at this time.  The differential diagnosis of these findings were discussed at this time and management options were reviewed.  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  Reactive causes will such as infection, medication and autoimmune diseases are considered less likely.  I see no evidence to suggest a hematological disorder such as lymphoproliferative disorder or myelodysplasia.  From a management standpoint, I recommended a period of observation and repeat laboratory testing in 6 months.  If further hematological derangements were noted, bone marrow biopsy would be indicated.  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  2.  Follow-up: We will be in 6 months for repeat evaluation.   45  minutes were dedicated to this visit. The time was spent on reviewing laboratory data, discussing treatment options, discussing differential diagnosis and answering questions regarding future plan.       A copy of this consult has been forwarded to the requesting physician.

## 2020-10-03 DIAGNOSIS — E785 Hyperlipidemia, unspecified: Secondary | ICD-10-CM | POA: Diagnosis not present

## 2020-11-25 DIAGNOSIS — H04123 Dry eye syndrome of bilateral lacrimal glands: Secondary | ICD-10-CM | POA: Diagnosis not present

## 2020-11-25 DIAGNOSIS — H5212 Myopia, left eye: Secondary | ICD-10-CM | POA: Diagnosis not present

## 2020-11-25 DIAGNOSIS — Z961 Presence of intraocular lens: Secondary | ICD-10-CM | POA: Diagnosis not present

## 2021-01-13 DIAGNOSIS — H9202 Otalgia, left ear: Secondary | ICD-10-CM | POA: Diagnosis not present

## 2021-01-13 DIAGNOSIS — H6122 Impacted cerumen, left ear: Secondary | ICD-10-CM | POA: Diagnosis not present

## 2021-02-15 IMAGING — CT CT CARDIAC CORONARY ARTERY CALCIUM SCORE
3 series · 14 of 20 positions shown, 16 images · non-contrast
Comparison: None.

CLINICAL DATA: Hyperlipidemia

EXAM:
CT CARDIAC CORONARY ARTERY CALCIUM SCORE
TECHNIQUE: Non-contrast imaging through the heart was performed using
prospective ECG gating. Image post processing was performed on an
independent workstation, allowing for quantitative analysis of the
heart and coronary arteries. Note that this exam targets the heart
and the chest was not imaged in its entirety.

[Series 2: calcium scoring 2.00 qr36 bestdiast 69% hrt calciu · axial · 0.35mm/px · z∈[+1468,+1552]mm · 4 of 70 slices shown]
[im 14/70  vessel]
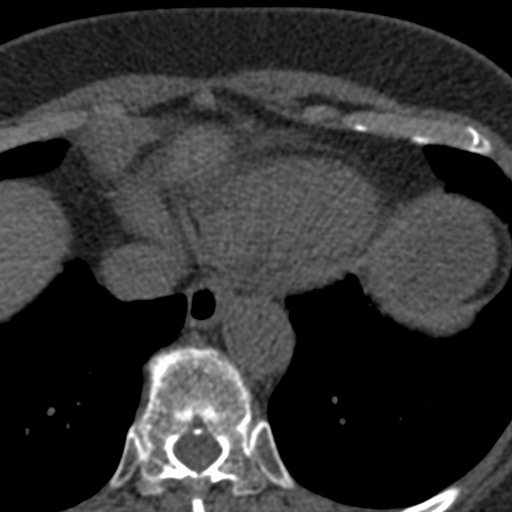
[im 28/70  vessel]
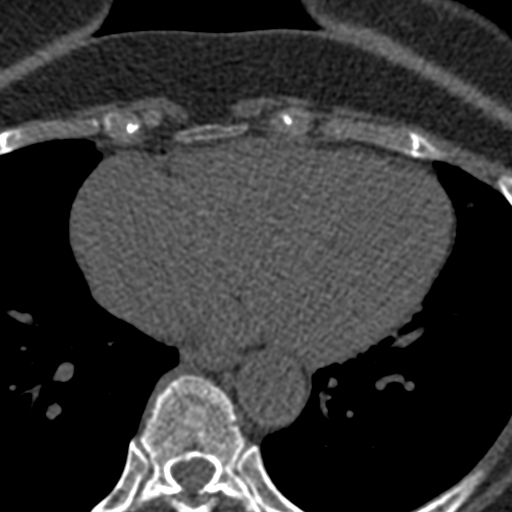
[im 42/70  vessel]
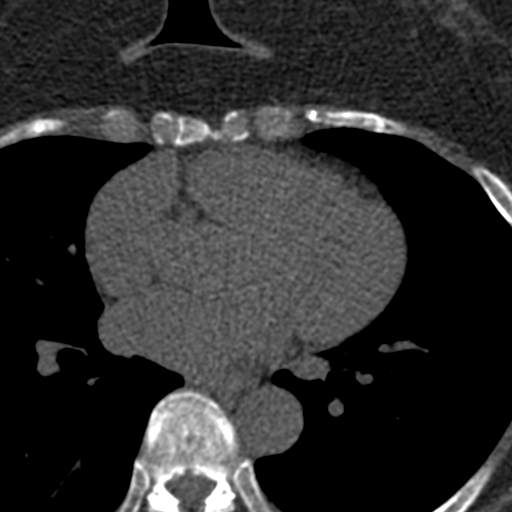
[im 56/70  vessel]
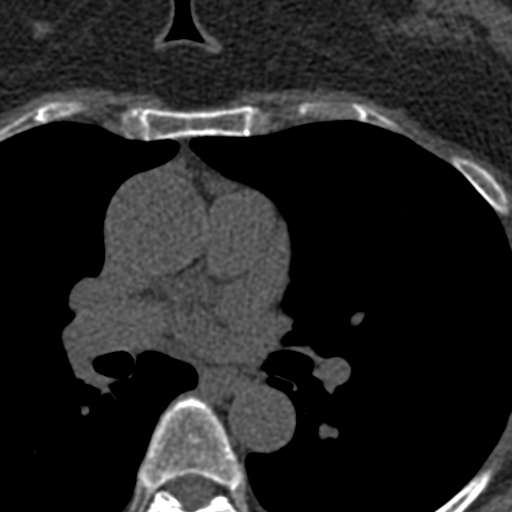

[Series 3: calcium scoring 2.00 br40 bestdiast 69% axial · axial · 0.53mm/px · z∈[+1464,+1556]mm · 5 of 70 slices shown, 7 images]
[im 12/70  vessel]
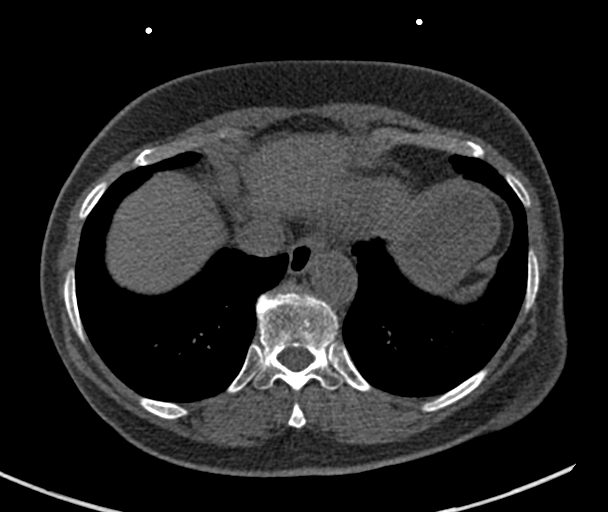
[im 12/70  lung]
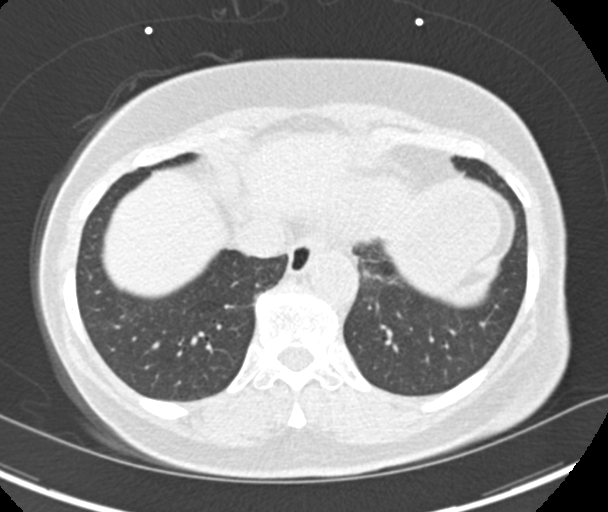
[im 24/70  vessel]
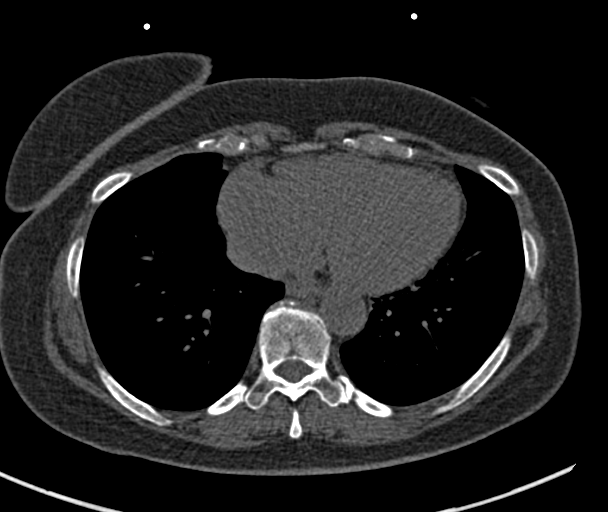
[im 35/70  vessel]
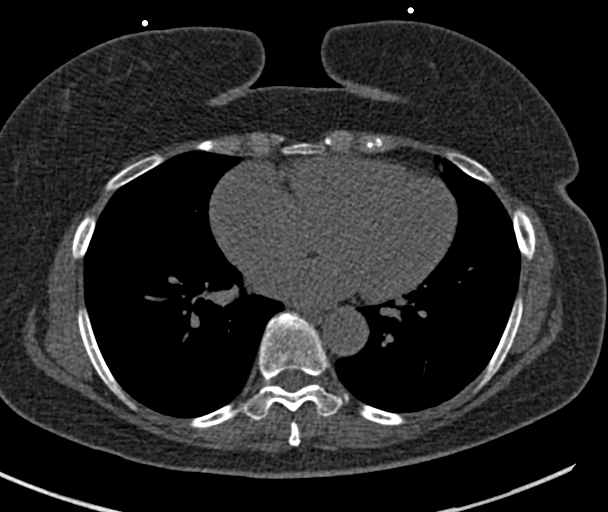
[im 47/70  vessel]
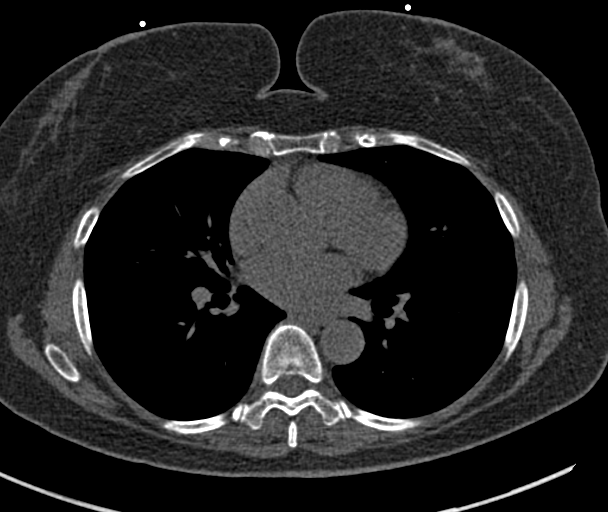
[im 58/70  vessel]
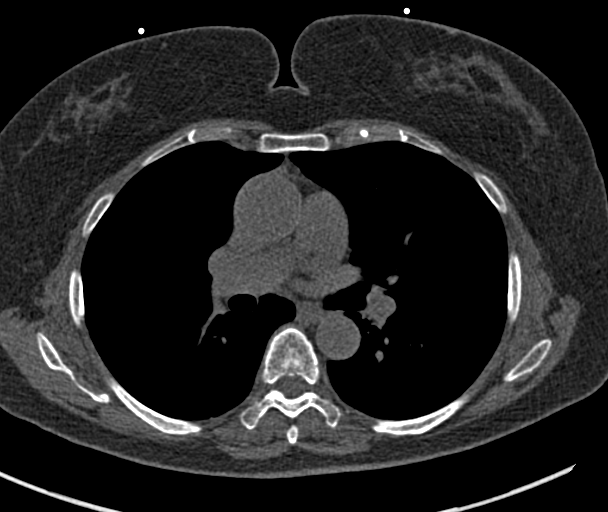
[im 58/70  lung]
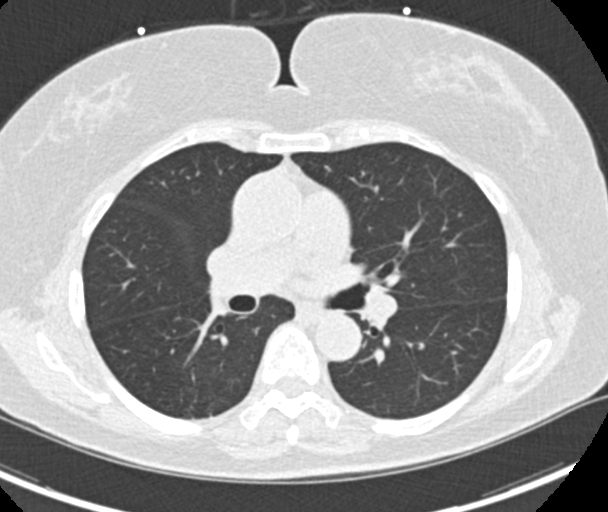

[Series 9: calcium scoring 2.00 br60 bestdiast 69% lungs · axial · 0.53mm/px · z∈[+1464,+1556]mm · 5 of 70 slices shown]
[im 12/70  vessel]
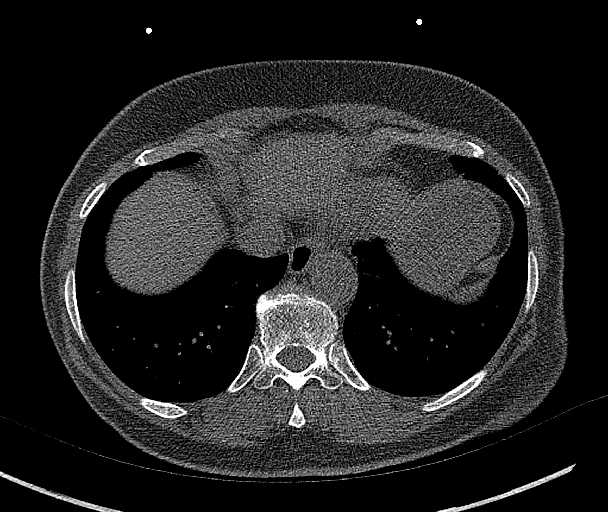
[im 24/70  vessel]
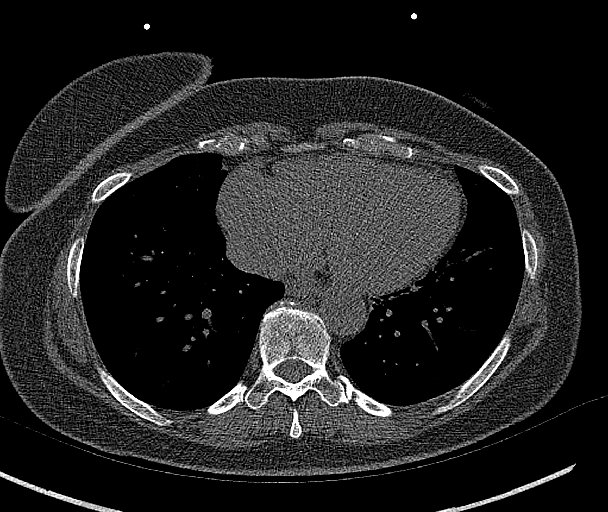
[im 35/70  vessel]
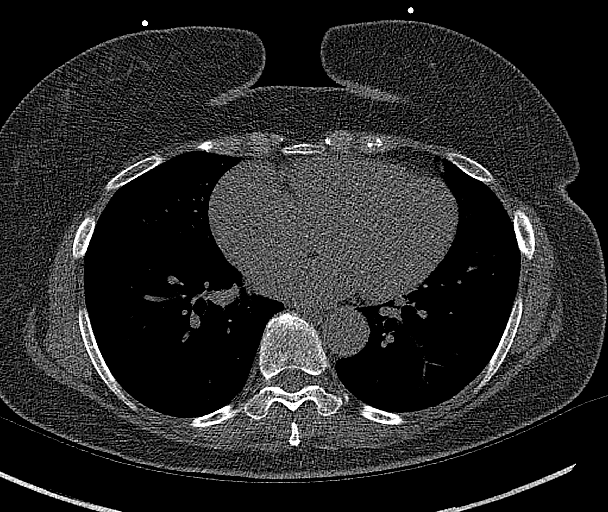
[im 47/70  vessel]
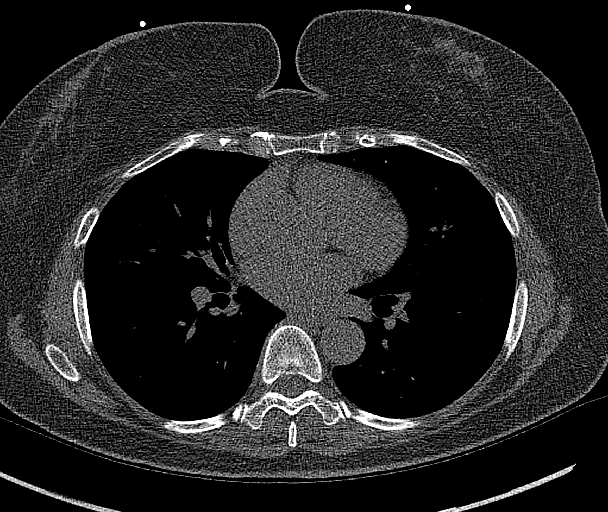
[im 58/70  vessel]
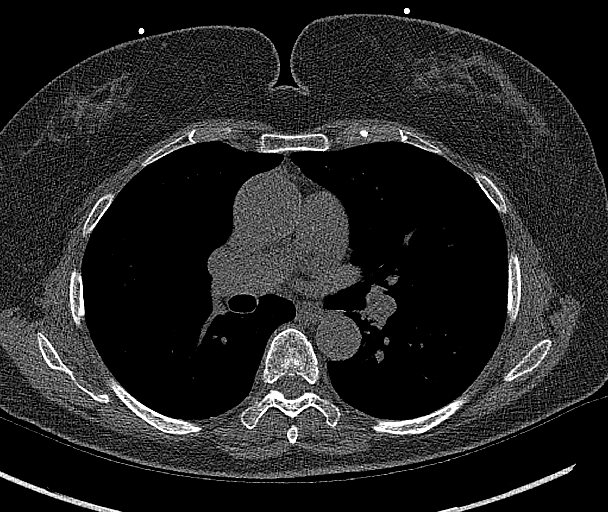

[14 of 20 positions shown; findings below may reference images not displayed]

FINDINGS: CORONARY CALCIUM SCORES:

Left Main: 0

LAD: 0

LCx: 0

RCA: 0

Total Agatston Score: 0

[HOSPITAL] percentile: 0

AORTA MEASUREMENTS:

Ascending Aorta: 34 mm

Descending Aorta: 24 mm

OTHER FINDINGS:

Cardiomegaly. No adenopathy. No confluent opacities or effusions.
Imaging into the upper abdomen shows no acute findings. Chest wall
soft tissues are unremarkable. No acute bony abnormality.
IMPRESSION: No visible coronary artery calcifications. Total coronary calcium
score of 0.

No acute extracardiac abnormality.

## 2021-03-12 ENCOUNTER — Inpatient Hospital Stay (HOSPITAL_BASED_OUTPATIENT_CLINIC_OR_DEPARTMENT_OTHER): Payer: Medicare HMO | Admitting: Oncology

## 2021-03-12 ENCOUNTER — Other Ambulatory Visit: Payer: Self-pay

## 2021-03-12 ENCOUNTER — Inpatient Hospital Stay: Payer: Medicare HMO | Attending: Oncology

## 2021-03-12 VITALS — BP 132/68 | HR 75 | Temp 98.3°F | Resp 17 | Ht 63.0 in | Wt 149.1 lb

## 2021-03-12 DIAGNOSIS — D709 Neutropenia, unspecified: Secondary | ICD-10-CM

## 2021-03-12 LAB — CBC WITH DIFFERENTIAL (CANCER CENTER ONLY)
Abs Immature Granulocytes: 0 10*3/uL (ref 0.00–0.07)
Basophils Absolute: 0 10*3/uL (ref 0.0–0.1)
Basophils Relative: 0 %
Eosinophils Absolute: 0.1 10*3/uL (ref 0.0–0.5)
Eosinophils Relative: 2 %
HCT: 41 % (ref 36.0–46.0)
Hemoglobin: 13.4 g/dL (ref 12.0–15.0)
Immature Granulocytes: 0 %
Lymphocytes Relative: 40 %
Lymphs Abs: 2 10*3/uL (ref 0.7–4.0)
MCH: 29.5 pg (ref 26.0–34.0)
MCHC: 32.7 g/dL (ref 30.0–36.0)
MCV: 90.3 fL (ref 80.0–100.0)
Monocytes Absolute: 0.5 10*3/uL (ref 0.1–1.0)
Monocytes Relative: 11 %
Neutro Abs: 2.3 10*3/uL (ref 1.7–7.7)
Neutrophils Relative %: 47 %
Platelet Count: 201 10*3/uL (ref 150–400)
RBC: 4.54 MIL/uL (ref 3.87–5.11)
RDW: 14.6 % (ref 11.5–15.5)
WBC Count: 4.9 10*3/uL (ref 4.0–10.5)
nRBC: 0 % (ref 0.0–0.2)

## 2021-03-12 NOTE — Progress Notes (Signed)
Hematology and Oncology Follow Up Visit  Rita Olsen [id number] 1945-11-01 76 y.o. 03/12/2021 1:16 PM Rita Olsen, MDPerini, Rita Leriche, MD   Principle Diagnosis: 76 year old with neutropenia diagnosed in June 2022.  She was found to have absolute neutrophil count of 900.  Etiology is related to benign causes.    Current therapy: Active surveillance.  Interim History: [name] returns today for a follow-up visit.  Since her last visit, she reports no major changes in her health.  She denies any recent hospitalizations or illnesses.  She denies any recurrent infections.  Performance status and quality of life remained excellent.    Medications: I have reviewed the patient's current medications.  Current Outpatient Medications  Medication Sig Dispense Refill   COD LIVER OIL PO Take by mouth.     Multiple Vitamin (MULTIVITAMIN PO) Take by mouth.     Polyethyl Glycol-Propyl Glycol (SYSTANE HYDRATION PF OP) Apply 1 drop to eye.     No current facility-administered medications for this visit.     Allergies: No Known Allergies   Physical Exam: Blood pressure 132/68, pulse 75, temperature 98.3 F (36.8 C), temperature source Temporal, resp. rate 17, height 5\' 3"  (1.6 m), weight 149 lb 1.6 oz (67.6 kg), SpO2 98 %.  ECOG: 1   General appearance: Comfortable appearing without any discomfort Head: Normocephalic without any trauma Oropharynx: Mucous membranes are moist and pink without any thrush or ulcers. Eyes: Pupils are equal and round reactive to light. Lymph nodes: No cervical, supraclavicular, inguinal or axillary lymphadenopathy.   Heart:regular rate and rhythm.  S1 and S2 without leg edema. Lung: Clear without any rhonchi or wheezes.  No dullness to percussion. Abdomin: Soft, nontender, nondistended with good bowel sounds.  No hepatosplenomegaly. Musculoskeletal: No joint deformity or effusion.  Full range of motion noted. Neurological: No deficits noted on motor, sensory and  deep tendon reflex exam. Skin: No petechial rash or dryness.  Appeared moist.        Impression and Plan:  76 year old with:  1.  Neutropenia diagnosed in June 2022.  She was found to have an absolute neutrophil count of 900 with normal CBC.   The differential diagnosis was reviewed again and treatment choices were discussed.  Benign variation versus medication versus autoimmune etiologies are considered.  Other possibilities such as lymphoproliferative disorder I considered less likely.  Laboratory data from today reviewed which showed a white cell count of 4.9 and neutrophil percentage of 47% and a normal absolute neutrophil count of 2300.  Based on these findings, I anticipate no further hematological work-up at this time.  Her neutropenia has resolved and if it does recur it is likely of a benign fluctuating etiology.  2.  Follow-up: I am happy to see her in the future as needed.  30  minutes were spent on this encounter.  The time was dedicated to reviewing laboratory data, disease status update, reviewing differential diagnosis and treatment choices in the future.    July 2022, MD 1/18/20231:16 PM

## 2021-03-19 DIAGNOSIS — H524 Presbyopia: Secondary | ICD-10-CM | POA: Diagnosis not present

## 2021-08-12 DIAGNOSIS — Z1231 Encounter for screening mammogram for malignant neoplasm of breast: Secondary | ICD-10-CM | POA: Diagnosis not present

## 2021-09-09 DIAGNOSIS — H40023 Open angle with borderline findings, high risk, bilateral: Secondary | ICD-10-CM | POA: Diagnosis not present

## 2021-09-09 DIAGNOSIS — Z961 Presence of intraocular lens: Secondary | ICD-10-CM | POA: Diagnosis not present

## 2021-10-01 DIAGNOSIS — R7301 Impaired fasting glucose: Secondary | ICD-10-CM | POA: Diagnosis not present

## 2021-10-01 DIAGNOSIS — E785 Hyperlipidemia, unspecified: Secondary | ICD-10-CM | POA: Diagnosis not present

## 2021-10-01 DIAGNOSIS — R7989 Other specified abnormal findings of blood chemistry: Secondary | ICD-10-CM | POA: Diagnosis not present

## 2021-10-01 DIAGNOSIS — Z0000 Encounter for general adult medical examination without abnormal findings: Secondary | ICD-10-CM | POA: Diagnosis not present

## 2021-10-01 DIAGNOSIS — E559 Vitamin D deficiency, unspecified: Secondary | ICD-10-CM | POA: Diagnosis not present

## 2021-10-01 DIAGNOSIS — H47292 Other optic atrophy, left eye: Secondary | ICD-10-CM | POA: Diagnosis not present

## 2021-10-09 ENCOUNTER — Other Ambulatory Visit: Payer: Self-pay | Admitting: Internal Medicine

## 2021-10-09 DIAGNOSIS — R9431 Abnormal electrocardiogram [ECG] [EKG]: Secondary | ICD-10-CM | POA: Diagnosis not present

## 2021-10-09 DIAGNOSIS — M8580 Other specified disorders of bone density and structure, unspecified site: Secondary | ICD-10-CM | POA: Diagnosis not present

## 2021-10-09 DIAGNOSIS — R7301 Impaired fasting glucose: Secondary | ICD-10-CM | POA: Diagnosis not present

## 2021-10-09 DIAGNOSIS — R7989 Other specified abnormal findings of blood chemistry: Secondary | ICD-10-CM

## 2021-10-09 DIAGNOSIS — Z0000 Encounter for general adult medical examination without abnormal findings: Secondary | ICD-10-CM | POA: Diagnosis not present

## 2021-10-09 DIAGNOSIS — R82998 Other abnormal findings in urine: Secondary | ICD-10-CM | POA: Diagnosis not present

## 2021-10-09 DIAGNOSIS — Z1331 Encounter for screening for depression: Secondary | ICD-10-CM | POA: Diagnosis not present

## 2021-10-09 DIAGNOSIS — Z1339 Encounter for screening examination for other mental health and behavioral disorders: Secondary | ICD-10-CM | POA: Diagnosis not present

## 2021-10-09 DIAGNOSIS — R011 Cardiac murmur, unspecified: Secondary | ICD-10-CM | POA: Diagnosis not present

## 2021-10-13 ENCOUNTER — Ambulatory Visit
Admission: RE | Admit: 2021-10-13 | Discharge: 2021-10-13 | Disposition: A | Discharge status: Home / Self Care | Payer: Medicare HMO | Source: Ambulatory Visit | Attending: Internal Medicine | Admitting: Internal Medicine

## 2021-10-13 DIAGNOSIS — N281 Cyst of kidney, acquired: Secondary | ICD-10-CM | POA: Diagnosis not present

## 2021-10-13 DIAGNOSIS — R7989 Other specified abnormal findings of blood chemistry: Secondary | ICD-10-CM

## 2022-01-08 DIAGNOSIS — M7712 Lateral epicondylitis, left elbow: Secondary | ICD-10-CM | POA: Diagnosis not present

## 2022-01-08 DIAGNOSIS — Z23 Encounter for immunization: Secondary | ICD-10-CM | POA: Diagnosis not present

## 2022-01-08 DIAGNOSIS — M25522 Pain in left elbow: Secondary | ICD-10-CM | POA: Diagnosis not present

## 2022-02-03 DIAGNOSIS — H40003 Preglaucoma, unspecified, bilateral: Secondary | ICD-10-CM | POA: Diagnosis not present

## 2022-04-27 DIAGNOSIS — R7301 Impaired fasting glucose: Secondary | ICD-10-CM | POA: Diagnosis not present

## 2022-04-27 DIAGNOSIS — R7989 Other specified abnormal findings of blood chemistry: Secondary | ICD-10-CM | POA: Diagnosis not present

## 2022-04-27 DIAGNOSIS — R011 Cardiac murmur, unspecified: Secondary | ICD-10-CM | POA: Diagnosis not present

## 2022-04-27 DIAGNOSIS — E8809 Other disorders of plasma-protein metabolism, not elsewhere classified: Secondary | ICD-10-CM | POA: Diagnosis not present

## 2022-04-27 DIAGNOSIS — D72819 Decreased white blood cell count, unspecified: Secondary | ICD-10-CM | POA: Diagnosis not present

## 2022-04-27 DIAGNOSIS — E785 Hyperlipidemia, unspecified: Secondary | ICD-10-CM | POA: Diagnosis not present

## 2022-04-27 DIAGNOSIS — E559 Vitamin D deficiency, unspecified: Secondary | ICD-10-CM | POA: Diagnosis not present

## 2022-04-27 DIAGNOSIS — M8580 Other specified disorders of bone density and structure, unspecified site: Secondary | ICD-10-CM | POA: Diagnosis not present

## 2022-07-01 DIAGNOSIS — M79645 Pain in left finger(s): Secondary | ICD-10-CM | POA: Diagnosis not present

## 2022-07-28 DIAGNOSIS — H40003 Preglaucoma, unspecified, bilateral: Secondary | ICD-10-CM | POA: Diagnosis not present

## 2022-07-28 DIAGNOSIS — H40013 Open angle with borderline findings, low risk, bilateral: Secondary | ICD-10-CM | POA: Diagnosis not present

## 2022-08-15 DIAGNOSIS — H524 Presbyopia: Secondary | ICD-10-CM | POA: Diagnosis not present

## 2022-08-15 DIAGNOSIS — H5203 Hypermetropia, bilateral: Secondary | ICD-10-CM | POA: Diagnosis not present

## 2022-08-18 DIAGNOSIS — Z1231 Encounter for screening mammogram for malignant neoplasm of breast: Secondary | ICD-10-CM | POA: Diagnosis not present

## 2022-09-10 DIAGNOSIS — M8589 Other specified disorders of bone density and structure, multiple sites: Secondary | ICD-10-CM | POA: Diagnosis not present

## 2022-09-16 DIAGNOSIS — M8580 Other specified disorders of bone density and structure, unspecified site: Secondary | ICD-10-CM | POA: Diagnosis not present

## 2022-09-16 DIAGNOSIS — E559 Vitamin D deficiency, unspecified: Secondary | ICD-10-CM | POA: Diagnosis not present

## 2022-11-26 DIAGNOSIS — Z1212 Encounter for screening for malignant neoplasm of rectum: Secondary | ICD-10-CM | POA: Diagnosis not present

## 2022-11-26 DIAGNOSIS — Z0000 Encounter for general adult medical examination without abnormal findings: Secondary | ICD-10-CM | POA: Diagnosis not present

## 2022-11-26 DIAGNOSIS — Z0189 Encounter for other specified special examinations: Secondary | ICD-10-CM | POA: Diagnosis not present

## 2022-11-26 DIAGNOSIS — Z1389 Encounter for screening for other disorder: Secondary | ICD-10-CM | POA: Diagnosis not present

## 2022-11-26 DIAGNOSIS — E559 Vitamin D deficiency, unspecified: Secondary | ICD-10-CM | POA: Diagnosis not present

## 2022-11-26 DIAGNOSIS — E785 Hyperlipidemia, unspecified: Secondary | ICD-10-CM | POA: Diagnosis not present

## 2022-11-26 DIAGNOSIS — R7301 Impaired fasting glucose: Secondary | ICD-10-CM | POA: Diagnosis not present

## 2022-11-26 DIAGNOSIS — M8580 Other specified disorders of bone density and structure, unspecified site: Secondary | ICD-10-CM | POA: Diagnosis not present

## 2022-12-03 DIAGNOSIS — E8809 Other disorders of plasma-protein metabolism, not elsewhere classified: Secondary | ICD-10-CM | POA: Diagnosis not present

## 2022-12-03 DIAGNOSIS — R82998 Other abnormal findings in urine: Secondary | ICD-10-CM | POA: Diagnosis not present

## 2022-12-03 DIAGNOSIS — R011 Cardiac murmur, unspecified: Secondary | ICD-10-CM | POA: Diagnosis not present

## 2022-12-03 DIAGNOSIS — R748 Abnormal levels of other serum enzymes: Secondary | ICD-10-CM | POA: Diagnosis not present

## 2022-12-03 DIAGNOSIS — M8580 Other specified disorders of bone density and structure, unspecified site: Secondary | ICD-10-CM | POA: Diagnosis not present

## 2022-12-03 DIAGNOSIS — Z0000 Encounter for general adult medical examination without abnormal findings: Secondary | ICD-10-CM | POA: Diagnosis not present

## 2022-12-03 DIAGNOSIS — R7301 Impaired fasting glucose: Secondary | ICD-10-CM | POA: Diagnosis not present

## 2022-12-03 DIAGNOSIS — M7712 Lateral epicondylitis, left elbow: Secondary | ICD-10-CM | POA: Diagnosis not present

## 2022-12-03 DIAGNOSIS — E785 Hyperlipidemia, unspecified: Secondary | ICD-10-CM | POA: Diagnosis not present

## 2022-12-30 DIAGNOSIS — H6123 Impacted cerumen, bilateral: Secondary | ICD-10-CM | POA: Diagnosis not present

## 2022-12-30 DIAGNOSIS — H938X3 Other specified disorders of ear, bilateral: Secondary | ICD-10-CM | POA: Diagnosis not present

## 2023-01-01 DIAGNOSIS — H52203 Unspecified astigmatism, bilateral: Secondary | ICD-10-CM | POA: Diagnosis not present

## 2023-01-01 DIAGNOSIS — Z961 Presence of intraocular lens: Secondary | ICD-10-CM | POA: Diagnosis not present

## 2023-01-01 DIAGNOSIS — H04123 Dry eye syndrome of bilateral lacrimal glands: Secondary | ICD-10-CM | POA: Diagnosis not present

## 2023-06-03 DIAGNOSIS — E785 Hyperlipidemia, unspecified: Secondary | ICD-10-CM | POA: Diagnosis not present

## 2023-06-03 DIAGNOSIS — R7301 Impaired fasting glucose: Secondary | ICD-10-CM | POA: Diagnosis not present

## 2023-08-23 DIAGNOSIS — Z1231 Encounter for screening mammogram for malignant neoplasm of breast: Secondary | ICD-10-CM | POA: Diagnosis not present

## 2023-12-30 DIAGNOSIS — M8580 Other specified disorders of bone density and structure, unspecified site: Secondary | ICD-10-CM | POA: Diagnosis not present

## 2023-12-30 DIAGNOSIS — Z8249 Family history of ischemic heart disease and other diseases of the circulatory system: Secondary | ICD-10-CM | POA: Diagnosis not present

## 2023-12-30 DIAGNOSIS — N189 Chronic kidney disease, unspecified: Secondary | ICD-10-CM | POA: Diagnosis not present

## 2023-12-30 DIAGNOSIS — E785 Hyperlipidemia, unspecified: Secondary | ICD-10-CM | POA: Diagnosis not present

## 2024-01-05 DIAGNOSIS — H524 Presbyopia: Secondary | ICD-10-CM | POA: Diagnosis not present

## 2024-01-05 DIAGNOSIS — H5212 Myopia, left eye: Secondary | ICD-10-CM | POA: Diagnosis not present

## 2024-01-05 DIAGNOSIS — Z961 Presence of intraocular lens: Secondary | ICD-10-CM | POA: Diagnosis not present

## 2024-01-05 DIAGNOSIS — H04123 Dry eye syndrome of bilateral lacrimal glands: Secondary | ICD-10-CM | POA: Diagnosis not present
